# Patient Record
Sex: Female | Born: 1950 | Hispanic: No | Smoking: Never smoker
Health system: Southern US, Community
[De-identification: ages and names within clinical notes are randomized; demographics above are authoritative.]

## PROBLEM LIST (undated history)

## (undated) DIAGNOSIS — F329 Major depressive disorder, single episode, unspecified: Secondary | ICD-10-CM

## (undated) DIAGNOSIS — K589 Irritable bowel syndrome without diarrhea: Secondary | ICD-10-CM

## (undated) DIAGNOSIS — K52839 Microscopic colitis, unspecified: Secondary | ICD-10-CM

## (undated) DIAGNOSIS — J329 Chronic sinusitis, unspecified: Secondary | ICD-10-CM

## (undated) DIAGNOSIS — F32A Depression, unspecified: Secondary | ICD-10-CM

## (undated) HISTORY — DX: Major depressive disorder, single episode, unspecified: F32.9

## (undated) HISTORY — DX: Irritable bowel syndrome without diarrhea: K58.9

## (undated) HISTORY — PX: ABDOMINAL HYSTERECTOMY: SHX81

## (undated) HISTORY — PX: HELLER MYOTOMY: SHX5259

## (undated) HISTORY — DX: Chronic sinusitis, unspecified: J32.9

## (undated) HISTORY — DX: Depression, unspecified: F32.A

## (undated) HISTORY — DX: Microscopic colitis, unspecified: K52.839

---

## 2003-03-16 ENCOUNTER — Other Ambulatory Visit: Admission: RE | Admit: 2003-03-16 | Discharge: 2003-03-16 | Payer: Self-pay | Admitting: Obstetrics and Gynecology

## 2004-04-15 ENCOUNTER — Other Ambulatory Visit: Admission: RE | Admit: 2004-04-15 | Discharge: 2004-04-15 | Payer: Self-pay | Admitting: Obstetrics and Gynecology

## 2005-05-12 ENCOUNTER — Other Ambulatory Visit: Admission: RE | Admit: 2005-05-12 | Discharge: 2005-05-12 | Payer: Self-pay | Admitting: Obstetrics and Gynecology

## 2006-06-25 ENCOUNTER — Encounter: Admission: RE | Admit: 2006-06-25 | Discharge: 2006-06-25 | Payer: Self-pay | Admitting: Obstetrics and Gynecology

## 2007-07-01 ENCOUNTER — Encounter: Admission: RE | Admit: 2007-07-01 | Discharge: 2007-07-01 | Payer: Self-pay | Admitting: Obstetrics and Gynecology

## 2010-05-18 ENCOUNTER — Encounter: Payer: Self-pay | Admitting: Obstetrics and Gynecology

## 2014-12-20 ENCOUNTER — Encounter: Payer: Self-pay | Admitting: Gastroenterology

## 2015-02-12 ENCOUNTER — Ambulatory Visit: Payer: Self-pay | Admitting: Gastroenterology

## 2015-03-20 ENCOUNTER — Ambulatory Visit: Payer: Self-pay | Admitting: Gastroenterology

## 2015-05-31 ENCOUNTER — Encounter: Payer: Self-pay | Admitting: Gastroenterology

## 2015-07-23 ENCOUNTER — Encounter: Payer: Self-pay | Admitting: Gastroenterology

## 2015-07-23 ENCOUNTER — Ambulatory Visit (INDEPENDENT_AMBULATORY_CARE_PROVIDER_SITE_OTHER): Payer: Medicare Other | Admitting: Gastroenterology

## 2015-07-23 VITALS — BP 128/70 | HR 84 | Ht 59.0 in | Wt 96.8 lb

## 2015-07-23 DIAGNOSIS — K589 Irritable bowel syndrome without diarrhea: Secondary | ICD-10-CM | POA: Diagnosis not present

## 2015-07-23 MED ORDER — ELUXADOLINE 75 MG PO TABS: 1.0000 | ORAL_TABLET | Freq: Two times a day (BID) | ORAL | Status: DC

## 2015-07-23 NOTE — Patient Instructions (Addendum)
We will get records sent from your previous gastroenterologist (surgery in [facility] for achalasia) for review.   Recent FDA warning about viberzi and pancreatitis reviewed. That risk in for patients who (unlike you) do not have a gallbladder. Please return to see Dr. Christella HartiganJacobs in 3 months.

## 2015-07-23 NOTE — Progress Notes (Addendum)
She brought with her extensive records and her [facility] [place]  Colonoscopy February 2000, done for Hemoccult-positive stool, grandmother died of colon cancer, diarrhea; the examination was normal EGD February 2000 done for the same as above, there was scattered erythema of the duodenum biopsies were taken these showed "nonspecific fibrosis and chronic inflammation" Gastroparesis ; Gastric emptying scan June 2014, done for early satiety, findings "very limited gastric emptying with marked delay emptying time consistent with significant gastroparesis".  Had been on Reglan for some time. June 2008 [facility] first step testing "pattern not consistent with IBS"  Achalasia ; Heller myotomy, Upper GI radiology test October 2013 done for "projectile vomiting and GERD "findings most suggestive of achalasia"[facility] 2014 or 15, done for achalasia, one year later she required an EGD from what she tells me   HPI: This is a  very pleasant 65 year old woman  who was self-referred  Chief complaint is IBS, history of achalasia, history of gastroparesis  Stomach trouble about 23 years; she has loose stools after meals. She has not been losing weight.  She was told she had achalasia, [facility] for surgery that sounds like Heller Myotomy; She had a second procedure about a year later (sounds like EGD) and she's been fine since then.  Her swallowing has improved since then.  She takes nexium once daily for minor GERD issues.  She is here today for a prescription for viberzi and this helps. She calls it a miracle drug;  This has stopped her diarrhea.  Takes 75mg  pill, one pill twice daily.  Has been on it for 10 months.  She takes imodium  Pretty rarely now, 1-2 doses per month.  She used to take imodium daily.  She's had several colonoscopies.   I cannot tell when her last colonoscopy was.  Review of systems: Pertinent  positive and negative review of systems were noted in the above HPI section. Complete review of systems was performed and was otherwise normal.   Past Medical History  Diagnosis Date  . Depression   . Chronic sinusitis   . Microscopic colitis   . IBS (irritable bowel syndrome)     Past Surgical History  Procedure Laterality Date  . Abdominal hysterectomy    . Heller myotomy      Current Outpatient Prescriptions  Medication Sig Dispense Refill  . ALPRAZolam (XANAX) 0.5 MG tablet Take 0.5 mg by mouth as needed for anxiety.    . Eluxadoline (VIBERZI) 75 MG TABS Take 1 tablet by mouth 2 (two) times daily.    Marland Kitchen. esomeprazole (NEXIUM) 40 MG capsule Take 40 mg by mouth daily at 12 noon.    . promethazine (PHENERGAN) 25 MG tablet Take 25 mg by mouth every 6 (six) hours as needed for nausea or vomiting.    Marland Kitchen. zolpidem (AMBIEN) 10 MG tablet Take 10 mg by mouth at bedtime as needed for sleep.     No current facility-administered medications for this visit.    Allergies as of 07/23/2015  . (Not on File)    Family History  Problem Relation Age of Onset  . Heart disease Mother   . Coronary artery disease Father     Social History   Social History  . Marital Status: Unknown    Spouse Name: N/A  . Number of Children: N/A  . Years of Education: N/A   Occupational History  . Not on file.   Social History  Main Topics  . Smoking status: Never Smoker   . Smokeless tobacco: Not on file  . Alcohol Use: No  . Drug Use: No  . Sexual Activity: Not on file   Other Topics Concern  . Not on file   Social History Narrative     Physical Exam: BP 128/70 mmHg  Pulse 84  Ht  (1.499 m)  Wt 96 lb 12.8 oz (43.908 kg)  BMI 19.54 kg/m2 Constitutional: generally well-appearing Psychiatric: alert and oriented x3 Eyes: extraocular movements intact Mouth: oral pharynx moist, no lesions Neck: supple no lymphadenopathy Cardiovascular: heart regular rate and rhythm Lungs: clear to  auscultation bilaterally Abdomen: soft, nontender, nondistended, no obvious ascites, no peritoneal signs, normal bowel sounds Extremities: no lower extremity edema bilaterally Skin: no lesions on visible extremities   Assessment and plan: 65 y.o. female with  History of IBS diarrhea predominant, achalasia, gastroparesis I reviewed a large packet of information from her previous gastroenterologist, piecing together a very scattered history. Seems that she has history of achalasia surgically repaired at [facility] [place]. She has gastroparesis confirmed by gastric emptying scan with very poor emptying of the stomach, she has irritable bowel syndrome, deer diarrhea predominant. She really is here for a new prescription, refills on her Viberzi.  She calls this a miracle drug. Previously she had been on several Imodium and Lomotil pills per day with poor results. The viberzi  has significantly improved her control. Recent FDA warning about this medicine in   patients who have had her gallbladder removed showed that they have increased risk of pancreatitis. She still has her gallbladder in place. I refilled a prescription 75 mg one pill twice daily. She'll need to return here in 3 months to touch base again seen she is feeling. Some information that I will still need to determine, date of her last colonoscopy, any polyps that she may have had the past. I don't think she has ever had any polyps and she does not have a family history of colon cancer.   Rob Bunting, MD [facility] 07/23/2015, 2:42 PM  Cc:     Addendum:  Op Note, Dr. Nolen Mu 02/2012: Robot assisted Heller myotomy; seemed to go smoothly.

## 2015-07-25 ENCOUNTER — Telehealth: Payer: Self-pay | Admitting: Gastroenterology

## 2015-07-25 NOTE — Telephone Encounter (Signed)
I spoke with the pharmacy and gave a verbal order for viberzi the pt was notified to pick the rx up at her convenience and to call with any further concerns

## 2015-07-26 ENCOUNTER — Telehealth: Payer: Self-pay | Admitting: Gastroenterology

## 2015-07-29 MED ORDER — ELUXADOLINE 75 MG PO TABS: 1.0000 | ORAL_TABLET | Freq: Two times a day (BID) | ORAL | Status: DC

## 2015-07-29 NOTE — Telephone Encounter (Signed)
Pt prescription has been resent to the pharmacy.  Placed a call to the pt but the number was incorrect, no further numbers available.

## 2015-07-30 ENCOUNTER — Telehealth: Payer: Self-pay | Admitting: Gastroenterology

## 2015-07-31 NOTE — Telephone Encounter (Signed)
Prior auth was sent to the pharmacy for review.

## 2015-10-02 ENCOUNTER — Other Ambulatory Visit: Payer: Self-pay | Admitting: Obstetrics and Gynecology

## 2015-10-02 DIAGNOSIS — R928 Other abnormal and inconclusive findings on diagnostic imaging of breast: Secondary | ICD-10-CM

## 2015-10-31 ENCOUNTER — Ambulatory Visit
Admission: RE | Admit: 2015-10-31 | Discharge: 2015-10-31 | Disposition: A | Discharge status: Home / Self Care | Payer: Medicare Other | Source: Ambulatory Visit | Attending: Obstetrics and Gynecology | Admitting: Obstetrics and Gynecology

## 2015-10-31 DIAGNOSIS — R928 Other abnormal and inconclusive findings on diagnostic imaging of breast: Secondary | ICD-10-CM

## 2016-02-05 ENCOUNTER — Telehealth: Payer: Self-pay | Admitting: Gastroenterology

## 2016-02-05 MED ORDER — ESOMEPRAZOLE MAGNESIUM 40 MG PO CPDR: 40.0000 mg | DELAYED_RELEASE_CAPSULE | Freq: Every day | ORAL | 2 refills | Status: DC

## 2016-02-05 NOTE — Telephone Encounter (Signed)
Pt has been notified of the refills and aware to keep appt as scheduled

## 2016-03-27 ENCOUNTER — Other Ambulatory Visit: Payer: Self-pay

## 2016-03-27 MED ORDER — ESOMEPRAZOLE MAGNESIUM 40 MG PO CPDR: 40.0000 mg | DELAYED_RELEASE_CAPSULE | Freq: Every day | ORAL | 0 refills | Status: DC

## 2016-03-27 NOTE — Telephone Encounter (Signed)
Esomeprazole sent it, has appt in January.

## 2016-05-01 ENCOUNTER — Ambulatory Visit: Payer: Medicare Other | Admitting: Gastroenterology

## 2016-05-12 ENCOUNTER — Ambulatory Visit (INDEPENDENT_AMBULATORY_CARE_PROVIDER_SITE_OTHER): Payer: Medicare Other | Admitting: Gastroenterology

## 2016-05-12 ENCOUNTER — Encounter: Payer: Self-pay | Admitting: Gastroenterology

## 2016-05-12 ENCOUNTER — Encounter (INDEPENDENT_AMBULATORY_CARE_PROVIDER_SITE_OTHER): Payer: Self-pay

## 2016-05-12 VITALS — BP 80/60 | HR 78 | Ht 59.0 in | Wt 97.1 lb

## 2016-05-12 DIAGNOSIS — K219 Gastro-esophageal reflux disease without esophagitis: Secondary | ICD-10-CM

## 2016-05-12 MED ORDER — RANITIDINE HCL 150 MG PO TABS: 150.0000 mg | ORAL_TABLET | Freq: Every day | ORAL | 3 refills | Status: DC

## 2016-05-12 MED ORDER — ELUXADOLINE 75 MG PO TABS: 1.0000 | ORAL_TABLET | Freq: Two times a day (BID) | ORAL | 3 refills | Status: DC

## 2016-05-12 MED ORDER — ESOMEPRAZOLE MAGNESIUM 40 MG PO CPDR: 40.0000 mg | DELAYED_RELEASE_CAPSULE | Freq: Every day | ORAL | 3 refills | Status: DC

## 2016-05-12 NOTE — Patient Instructions (Addendum)
You should be eating several small meals per day because of your gastroparesis. Stay on nexium in AM; 20-30 min before breakfast. Start ranitidine 150mg , take one pill at bedtime every night. Call in 4-5 weeks to report on your response. Refills on viberzi, nexium (90 days with 3 refills).

## 2016-05-12 NOTE — Progress Notes (Signed)
HPI: This is a pleasant 66 year old woman whom I met originally about a year ago.  She was previously cared for in [place]. She has an extensive history with gastroenterologist up there. I met her for the first time about a year ago. Took quite some time to review all her extensive notes from her previous procedures, office visits from [place]. She has a history of achalasia surgically repaired the Heller myotomy, she has diarrhea predominant irritable bowel syndrome it seems to be under good control on Viberzi, she has gastroparesis confirmed by gastric emptying scan.   Chief complaint is liquid regurgitation, GERD  After drinking or eating she feels a liquid regurgitating, phlegm like.  Sometimes at night she will have to vomit.  Can also be shortly after eating.  This occurs only occasionally.   Takes nexium 60 min before breakfast.   She does not take any bedtime acid meds  No dysphagia.  No chest pains.  Overall weight has been increasing, gradually.  ROS: complete GI ROS as described in HPI.  Constitutional:  No unintentional weight loss   Past Medical History:  Diagnosis Date  . Chronic sinusitis   . Depression   . IBS (irritable bowel syndrome)   . Microscopic colitis     Past Surgical History:  Procedure Laterality Date  . ABDOMINAL HYSTERECTOMY    . HELLER MYOTOMY      Current Outpatient Prescriptions  Medication Sig Dispense Refill  . ALPRAZolam (XANAX) 0.5 MG tablet Take 0.5 mg by mouth as needed for anxiety.    . Eluxadoline (VIBERZI) 75 MG TABS Take 1 tablet by mouth 2 (two) times daily. 180 tablet 3  . promethazine (PHENERGAN) 25 MG tablet Take 25 mg by mouth every 6 (six) hours as needed for nausea or vomiting.    Marland Kitchen zolpidem (AMBIEN) 10 MG tablet Take 10 mg by mouth at bedtime as needed for sleep.    Marland Kitchen esomeprazole (NEXIUM) 40 MG capsule Take 1 capsule (40 mg total) by mouth daily at 12 noon. 90 capsule 0   No current facility-administered  medications for this visit.     Allergies as of 05/12/2016 - Review Complete 05/12/2016  Allergen Reaction Noted  . Penicillins  05/12/2016    Family History  Problem Relation Age of Onset  . Heart disease Mother   . Coronary artery disease Father     Social History   Social History  . Marital status: Divorced    Spouse name: N/A  . Number of children: 1  . Years of education: N/A   Occupational History  . retired    Social History Main Topics  . Smoking status: Never Smoker  . Smokeless tobacco: Never Used  . Alcohol use No  . Drug use: No  . Sexual activity: Not on file   Other Topics Concern  . Not on file   Social History Narrative  . No narrative on file     Physical Exam: BP (!) 80/60   Pulse 78   Ht 4' 11" (1.499 m)   Wt 97 lb 2 oz (44.1 kg)   BMI 19.62 kg/m  Constitutional: generally well-appearing Psychiatric: alert and oriented x3 Abdomen: soft, nontender, nondistended, no obvious ascites, no peritoneal signs, normal bowel sounds No peripheral edema noted in lower extremities  Assessment and plan: 66 y.o. female with History of achalasia, status post myotomy, chronic GERD  She has no current alarm symptoms that would necessitate further evaluation with upper endoscopy. She is not  taking her Nexium at the correct time and relation to meals and so I recommended she change that. I'm also adding H2 blocker at bedtime since she seems to have some regurgitation, vomiting overnight is probably GERD related. She needs refills on her Nexium and also refills on her Viberzi and happy to do that for her. She will call to report on her response to the medicine changes in 3-4 weeks and sooner if she is having any troubles.   Owens Loffler, MD [facility] 05/12/2016, 4:01 PM

## 2017-04-09 ENCOUNTER — Other Ambulatory Visit: Payer: Self-pay

## 2017-04-09 ENCOUNTER — Ambulatory Visit (INDEPENDENT_AMBULATORY_CARE_PROVIDER_SITE_OTHER): Payer: Medicare Other | Admitting: Gastroenterology

## 2017-04-09 ENCOUNTER — Encounter: Payer: Self-pay | Admitting: Gastroenterology

## 2017-04-09 VITALS — BP 104/64 | HR 65 | Ht 59.0 in | Wt 95.0 lb

## 2017-04-09 DIAGNOSIS — K219 Gastro-esophageal reflux disease without esophagitis: Secondary | ICD-10-CM

## 2017-04-09 DIAGNOSIS — R1310 Dysphagia, unspecified: Secondary | ICD-10-CM

## 2017-04-09 MED ORDER — PROMETHAZINE HCL 25 MG PO TABS: 25.0000 mg | ORAL_TABLET | Freq: Every day | ORAL | 3 refills | Status: AC | PRN

## 2017-04-09 MED ORDER — ESOMEPRAZOLE MAGNESIUM 40 MG PO CPDR: 40.0000 mg | DELAYED_RELEASE_CAPSULE | Freq: Every day | ORAL | 3 refills | Status: DC

## 2017-04-09 MED ORDER — ELUXADOLINE 75 MG PO TABS: 1.0000 | ORAL_TABLET | Freq: Every day | ORAL | 3 refills | Status: DC

## 2017-04-09 NOTE — Patient Instructions (Addendum)
[facility] for dysphagia, h/o heller myotomy   Refills on viberzi 75mg  once daily, nexium 40mg  once daily, promethatzine 25mg  once daily PRN). 90 days with 3 refills.  Normal BMI (Body Mass Index- based on height and weight) is between 23 and 30. Your BMI today is Body mass index is 19.19 kg/m. Marland Kitchen. Please consider follow up  regarding your BMI with your Primary Care Provider.

## 2017-04-09 NOTE — Progress Notes (Signed)
HPI: This is a very pleasant 66 year old woman whom I last saw almost a year ago.  Chief complaint is medicine refills, also intermittent odynophasia, dysphasia  Needs refills on several of her medicines (Nexium, Viberzi, Phenergan)  While eating she has retrosternal chest discomfort; will vomit phelgm.  This is not with every meal but at least several times a week.  She says this is not nearly as bad as when she had significant achalasia troubles leading into her Heller myotomy about 7 years ago at [place] [facility] [place].  She was previously cared for in [place]. She has an extensive history with gastroenterologist up there. I met her for the first time about a year ago. Took quite some time to review all her extensive notes from her previous procedures, office visits from [place]. She has a history of achalasia surgically repaired the Heller myotomy, she has diarrhea predominant irritable bowel syndrome it seems to be under good control on Viberzi, she has gastroparesis confirmed by gastric emptying scan.  She had heller myotomy at age 2  (at [facility]), then repeat surgery about a year later : she is not really sure what was done during the second surgery (also at [facility]).    Takes nexium once daily 61m pill, usually before breakfast.  She still gets pyrosis with spells of odyphagia above  Overall her weight is up 3-4 pounds in 3 months.  Bowels are regular, takes viberzi once daily. He ROS: complete GI ROS as described in HPI, all other review negative.  Constitutional:  No unintentional weight loss   Past Medical History:  Diagnosis Date  . Chronic sinusitis   . Depression   . IBS (irritable bowel syndrome)   . Microscopic colitis     Past Surgical History:  Procedure Laterality Date  . ABDOMINAL HYSTERECTOMY    . HELLER MYOTOMY      Current Outpatient Medications  Medication Sig Dispense Refill  . ALPRAZolam (XANAX) 0.5 MG tablet Take 0.5 mg by mouth as needed  for anxiety.    . ALPRAZolam (XANAX) 1 MG tablet Take 0.5 mg by mouth at bedtime as needed for anxiety.    . Eluxadoline (VIBERZI) 75 MG TABS Take 1 tablet by mouth daily.    .Marland Kitchenesomeprazole (NEXIUM) 40 MG capsule Take 40 mg by mouth daily.    . promethazine (PHENERGAN) 25 MG tablet Take 25 mg by mouth every 6 (six) hours as needed for nausea or vomiting.    .Marland Kitchenzolpidem (AMBIEN) 10 MG tablet Take 10 mg by mouth at bedtime as needed for sleep.     No current facility-administered medications for this visit.     Allergies as of 04/09/2017 - Review Complete 04/09/2017  Allergen Reaction Noted  . Penicillins  05/12/2016    Family History  Problem Relation Age of Onset  . Heart disease Mother   . Coronary artery disease Father   . Lupus Daughter   . Arthritis Daughter        RA  . Depression Daughter        lost child in MVA    Social History   Socioeconomic History  . Marital status: Divorced    Spouse name: Not on file  . Number of children: 1  . Years of education: Not on file  . Highest education level: Not on file  Social Needs  . Financial resource strain: Not on file  . Food insecurity - worry: Not on file  . Food insecurity - inability:  Not on file  . Transportation needs - medical: Not on file  . Transportation needs - non-medical: Not on file  Occupational History  . Occupation: retired  Tobacco Use  . Smoking status: Never Smoker  . Smokeless tobacco: Never Used  Substance and Sexual Activity  . Alcohol use: No    Alcohol/week: 0.0 oz  . Drug use: No  . Sexual activity: Not on file  Other Topics Concern  . Not on file  Social History Narrative  . Not on file     Physical Exam: BP 104/64   Pulse 65   Ht _0  (1.499 m)   Wt 95 lb (43.1 kg)   BMI 19.19 kg/m  Constitutional: generally well-appearing Psychiatric: alert and oriented x3 Abdomen: soft, nontender, nondistended, no obvious ascites, no peritoneal signs, normal bowel sounds No peripheral  edema noted in lower extremities  Assessment and plan: 66 y.o. female with irritable bowel syndrome, dysphasia with a history of Heller myotomy for achalasia  She describes episodes of dysphasia that resulted in a lot of phlegm regurgitation and vomiting.  These do not happen every day but it sounds like once or twice a week at least.  [facility] if she is getting a stricture at her GE junction related to previous Heller myotomy or perhaps resulting acid damage after the Heller myotomy.  I recommended we proceed with EGD and possible dilation.  Going to refill her usual IBS medicine, GERD medicine and antinausea medicine.  Please see the "Patient Instructions" section for addition details about the plan.  Owens Loffler, MD [facility] 04/09/2017, 9:54 AM

## 2017-05-21 ENCOUNTER — Encounter: Payer: Self-pay | Admitting: Gastroenterology

## 2017-06-04 ENCOUNTER — Other Ambulatory Visit: Payer: Self-pay

## 2017-06-04 ENCOUNTER — Encounter: Payer: Self-pay | Admitting: Gastroenterology

## 2017-06-04 ENCOUNTER — Ambulatory Visit (AMBULATORY_SURGERY_CENTER): Payer: Medicare Other | Admitting: Gastroenterology

## 2017-06-04 VITALS — BP 128/63 | HR 62 | Temp 98.9°F | Resp 13 | Ht 59.0 in | Wt 95.0 lb

## 2017-06-04 DIAGNOSIS — R1310 Dysphagia, unspecified: Secondary | ICD-10-CM | POA: Diagnosis not present

## 2017-06-04 DIAGNOSIS — K220 Achalasia of cardia: Secondary | ICD-10-CM | POA: Diagnosis not present

## 2017-06-04 DIAGNOSIS — K222 Esophageal obstruction: Secondary | ICD-10-CM | POA: Diagnosis not present

## 2017-06-04 HISTORY — PX: UPPER GASTROINTESTINAL ENDOSCOPY: SHX188

## 2017-06-04 MED ORDER — SODIUM CHLORIDE 0.9 % IV SOLN: 500.0000 mL | Freq: Once | INTRAVENOUS | Status: DC

## 2017-06-04 NOTE — Progress Notes (Signed)
I have reviewed the patient's medical history in detail and updated the computerized patient record.

## 2017-06-04 NOTE — Patient Instructions (Signed)
YOU HAD AN ENDOSCOPIC PROCEDURE TODAY AT THE [facility]:   Refer to the procedure report that was given to you for any specific questions about what was found during the examination.  If the procedure report does not answer your questions, please call your gastroenterologist to clarify.  If you requested that your care partner not be given the details of your procedure findings, then the procedure report has been included in a sealed envelope for you to review at your convenience later.  YOU SHOULD EXPECT: Some feelings of bloating in the abdomen. Passage of more gas than usual.  Walking can help get rid of the air that was put into your GI tract during the procedure and reduce the bloating. If you had a lower endoscopy (such as a colonoscopy or flexible sigmoidoscopy) you may notice spotting of blood in your stool or on the toilet paper. If you underwent a bowel prep for your procedure, you may not have a normal bowel movement for a few days.  Please Note:  You might notice some irritation and congestion in your nose or some drainage.  This is from the oxygen used during your procedure.  There is no need for concern and it should clear up in a day or so.  SYMPTOMS TO REPORT IMMEDIATELY:   Following upper endoscopy (EGD)  Vomiting of blood or coffee ground material  New chest pain or pain under the shoulder blades  Painful or persistently difficult swallowing  New shortness of breath  Fever of 100F or higher  Black, tarry-looking stools  Please see handouts given to you on esophagitis and stricture.  For urgent or emergent issues, a gastroenterologist can be reached at any hour by calling [phone].   DIET: Clear liquids to start at 9:10 am until 10:10 am then soft diet for the rest of the day. Please see and handout on post dilation diet. Tomorrow you may proceed to your regular diet.  Drink plenty of fluids but you should avoid alcoholic beverages for 24  hours.  ACTIVITY:  You should plan to take it easy for the rest of today and you should NOT DRIVE or use heavy machinery until tomorrow (because of the sedation medicines used during the test).    FOLLOW UP: Our staff will call the number listed on your records the next business day following your procedure to check on you and address any questions or concerns that you may have regarding the information given to you following your procedure. If we do not reach you, we will leave a message.  However, if you are feeling well and you are not experiencing any problems, there is no need to return our call.  We will assume that you have returned to your regular daily activities without incident.  If any biopsies were taken you will be contacted by phone or by letter within the next 1-3 weeks.  Please call us at [phone] if you have not heard about the biopsies in 3 weeks.    SIGNATURES/CONFIDENTIALITY: You and/or your care partner have signed paperwork which will be entered into your electronic medical record.  These signatures attest to the fact that that the information above on your After Visit Summary has been reviewed and is understood.  Full responsibility of the confidentiality of this discharge information lies with you and/or your care-partner.  Thank you for letting us take care of your healthcare needs today.

## 2017-06-04 NOTE — Op Note (Signed)
[facility] Patient Name: Hannah Murillo Procedure Date: 06/04/2017 7:48 AM MRN: [id number] Endoscopist: Rachael Fee , MD Age: 67 Referring MD:  Date of Birth: [birth year] Gender: Female Account #: [id number] Procedure:                Upper GI endoscopy Indications:              Dysphagia; s/p remote Heller myotomy at [facility] for                            achalasia, one year later another UGI surgery of                            some type. No weigth loss. Medicines:                Monitored Anesthesia Care Procedure:                Pre-Anesthesia Assessment:                           - Prior to the procedure, a History and Physical                            was performed, and patient medications and                            allergies were reviewed. The patient's tolerance of                            previous anesthesia was also reviewed. The risks                            and benefits of the procedure and the sedation                            options and risks were discussed with the patient.                            All questions were answered, and informed consent                            was obtained. Prior Anticoagulants: The patient has                            taken no previous anticoagulant or antiplatelet                            agents. ASA Grade Assessment: II - A patient with                            mild systemic disease. After reviewing the risks                            and benefits, the patient was deemed in  satisfactory condition to undergo the procedure.                           After obtaining informed consent, the endoscope was                            passed under direct vision. Throughout the                            procedure, the patient's blood pressure, pulse, and                            oxygen saturations were monitored continuously. The                            Model GIF-[id number](SN#[id number])  scope was introduced                            through the mouth, and advanced to the second part                            of duodenum. The upper GI endoscopy was                            accomplished without difficulty. The patient                            tolerated the procedure well. Scope In: Scope Out: Findings:                 Evidence of previous Heller myotomy at the GE                            junction. The lumen through the site was slightly                            narrow with normal mucosa. Dilation with an                            18-19-20 mm TTS balloon dilator was performed to 20                            mm.                           The esophagus proximal to GE junction was dilated.                           The examination was otherwise normal. Complications:            No immediate complications. Estimated blood loss:                            None. Estimated Blood Loss:     Estimated blood loss: none. Impression:               -  Heller myotomy site note, slightly narrowed and                            was therefore dilated with TTS balloon.                           - The esophagus was dilated overall.                           - I suspect your trouble swallowing is mainly                            related to progression of your Achalasia instead of                            the slight narrowing noted above. Recommendation:           - Patient has a contact number available for                            emergencies. The signs and symptoms of potential                            delayed complications were discussed with the                            patient. Return to normal activities tomorrow.                            Written discharge instructions were provided to the                            patient.                           - Resume previous diet.                           - Continue present medications.                           -  Please call Dr. Christella Hartigan' office in 3-4 week to                            report your swallowing response to this dilation.                            If you are still having difficulty, will consider                            manometry to test to 'restage' your achalasia. Rachael Fee, MD 06/04/2017 8:16:37 AM This report has been signed electronically.

## 2017-06-04 NOTE — Progress Notes (Signed)
Called to room to assist during endoscopic procedure.  Patient ID and intended procedure confirmed with present staff. Received instructions for my participation in the procedure from the performing physician.  

## 2017-06-04 NOTE — Progress Notes (Signed)
A and O x3. Report to RN. Tolerated MAC anesthesia well.Teeth unchanged after procedure.

## 2017-06-07 ENCOUNTER — Telehealth: Payer: Self-pay

## 2017-06-07 NOTE — Telephone Encounter (Signed)
  Follow up Call-  Call back number 06/04/2017  Post procedure Call Back phone  # [phone]  Permission to leave phone message Yes  Some recent data might be hidden     Patient questions:  Do you have a fever, pain , or abdominal swelling? No. Pain Score  0 *  Have you tolerated food without any problems? Yes.    Have you been able to return to your normal activities? Yes.    Do you have any questions about your discharge instructions: Diet   No. Medications  No. Follow up visit  No.  Do you have questions or concerns about your Care? No.  Actions: * If pain score is 4 or above: No action needed, pain <4.   Pt had questions regarding checking at [facility], chart reviewed and no new medications ordered.  Pt also questioned what sodium chloride is, stated that it was her IV that she had received.  Pt stated understanding of all.

## 2017-06-26 ENCOUNTER — Other Ambulatory Visit: Payer: Self-pay | Admitting: Gastroenterology

## 2017-08-23 ENCOUNTER — Encounter: Payer: Self-pay | Admitting: Gastroenterology

## 2017-08-23 ENCOUNTER — Telehealth: Payer: Self-pay | Admitting: Gastroenterology

## 2017-08-23 ENCOUNTER — Ambulatory Visit: Payer: Medicare Other | Admitting: Gastroenterology

## 2017-08-23 ENCOUNTER — Encounter (INDEPENDENT_AMBULATORY_CARE_PROVIDER_SITE_OTHER): Payer: Self-pay

## 2017-08-23 VITALS — BP 114/62 | HR 70 | Ht 59.0 in | Wt 93.2 lb

## 2017-08-23 DIAGNOSIS — K220 Achalasia of cardia: Secondary | ICD-10-CM | POA: Diagnosis not present

## 2017-08-23 DIAGNOSIS — R079 Chest pain, unspecified: Secondary | ICD-10-CM | POA: Diagnosis not present

## 2017-08-23 NOTE — Patient Instructions (Addendum)
If you have increasing problems with your eating, please call and likely we will schedule Esophageal manometry testing. Referral to cardiology for intermittent chest pains, mild SOB.  Normal BMI (Body Mass Index- based on height and weight) is between 23 and 30. Your BMI today is Body mass index is 18.83 kg/m. Marland Kitchen Please consider follow up  regarding your BMI with your Primary Care Provider.

## 2017-08-23 NOTE — Progress Notes (Signed)
Review of pertinent gastrointestinal problems: 1. Achalasia: remote Heller myotomy at [facility], post operatively she needed another operation on her esophagus/stomach (?). Recurrent dysphagia led to EGD Dr. Christella Hartigan 05/2017: Heller myotomy site was noted, was slightly narrow and so was dilated with a balloon to 20 mm.  The esophagus was dilated overall.  I suspected that her swallowing difficulty was related to achalasia effect on esophageal peristalsis rather than the minor narrowing at the [facility] myotomy site.  HPI: This is a very pleasant 67 year old woman whom I last saw the time of an upper endoscopy  She has burning in her chest with phlegm production during eating.  Very rare pains in her chest, jaw pain during eating.  She really has noticed no improvement, changes since the EGD dilation 3 months ago.  She is under a lot of stress, child back at home causing her a lot of stress recently  Chief complaint is intermittent chest pains  ROS: complete GI ROS as described in HPI, all other review negative.  Constitutional:  No unintentional weight loss   Past Medical History:  Diagnosis Date  . Chronic sinusitis   . Depression   . IBS (irritable bowel syndrome)   . Microscopic colitis     Past Surgical History:  Procedure Laterality Date  . ABDOMINAL HYSTERECTOMY    . HELLER MYOTOMY    . UPPER GASTROINTESTINAL ENDOSCOPY  06/04/2017    Current Outpatient Medications  Medication Sig Dispense Refill  . ALPRAZolam (XANAX) 1 MG tablet Take 0.5 mg by mouth at bedtime as needed for anxiety.    . Eluxadoline (VIBERZI) 75 MG TABS Take 1 tablet by mouth daily. 90 tablet 3  . esomeprazole (NEXIUM) 40 MG capsule Take 1 capsule (40 mg total) by mouth daily. 90 capsule 3  . esomeprazole (NEXIUM) 40 MG capsule TAKE ONE CAPSULE BY MOUTH AT 12 NOON 90 capsule 2  . promethazine (PHENERGAN) 25 MG tablet Take 1 tablet (25 mg total) by mouth daily as needed for nausea or vomiting. 90 tablet 3  . zolpidem  (AMBIEN) 10 MG tablet Take 10 mg by mouth at bedtime as needed for sleep.     Current Facility-Administered Medications  Medication Dose Route Frequency Provider Last Rate Last Dose  . 0.9 %  sodium chloride infusion  500 mL Intravenous Once Rachael Fee, MD        Allergies as of 08/23/2017 - Review Complete 08/23/2017  Allergen Reaction Noted  . Penicillins  05/12/2016    Family History  Problem Relation Age of Onset  . Heart disease Mother   . Coronary artery disease Father   . Lupus Daughter   . Arthritis Daughter        RA  . Depression Daughter        lost child in MVA    Social History   Socioeconomic History  . Marital status: Divorced    Spouse name: Not on file  . Number of children: 1  . Years of education: Not on file  . Highest education level: Not on file  Occupational History  . Occupation: retired  Engineer, production  . Financial resource strain: Not on file  . Food insecurity:    Worry: Not on file    Inability: Not on file  . Transportation needs:    Medical: Not on file    Non-medical: Not on file  Tobacco Use  . Smoking status: Never Smoker  . Smokeless tobacco: Never Used  Substance and Sexual Activity  .  Alcohol use: No    Alcohol/week: 0.0 oz  . Drug use: No  . Sexual activity: Not on file  Lifestyle  . Physical activity:    Days per week: Not on file    Minutes per session: Not on file  . Stress: Not on file  Relationships  . Social connections:    Talks on phone: Not on file    Gets together: Not on file    Attends religious service: Not on file    Active member of club or organization: Not on file    Attends meetings of clubs or organizations: Not on file    Relationship status: Not on file  . Intimate partner violence:    Fear of current or ex partner: Not on file    Emotionally abused: Not on file    Physically abused: Not on file    Forced sexual activity: Not on file  Other Topics Concern  . Not on file  Social History  Narrative  . Not on file     Physical Exam: BP 114/62   Pulse 70   Ht  (1.499 m)   Wt 93 lb 4 oz (42.3 kg)   BMI 18.83 kg/m  Constitutional: generally well-appearing Psychiatric: alert and oriented x3 Abdomen: soft, nontender, nondistended, no obvious ascites, no peritoneal signs, normal bowel sounds No peripheral edema noted in lower extremities  Assessment and plan: 67 y.o. female with intermittent chest pains  First she does have intermittent phlegm in her throat, burning in her chest.  Some swallowing difficulty at times.  The esophageal dilation does not seem to have made much improvement.  I offered further testing to see if her achalasia has progressed with an esophageal manometry however she does not want to pursue that currently.  She will call if she changes her mind.  She also has some pressure-like chest pain associate with mild shortness of breath.  These episodes last about 2 minutes.  They are not necessarily related to exercise.  They are not related to eating.  I recommended she see a cardiologist for further evaluation for it.  She agrees I will make that referral.  Please see the "Patient Instructions" section for addition details about the plan.  Rob Bunting, MD [facility] 08/23/2017, 11:14 AM

## 2017-08-23 NOTE — Telephone Encounter (Signed)
Cardiology referral made today for patient to see Dr Teofilo Pod at [facility] on 08/27/17 at 3;30pm for SOB and CP. A message was left on patient voice mail with information regarding her appointment. I asked her to call the office to voice understanding. All of today's office visit notes were faxed to there office.

## 2018-02-01 ENCOUNTER — Ambulatory Visit: Payer: Medicare Other | Admitting: Gastroenterology

## 2018-02-17 ENCOUNTER — Other Ambulatory Visit: Payer: Self-pay | Admitting: Gastroenterology

## 2018-02-17 MED ORDER — ELUXADOLINE 75 MG PO TABS: 75.0000 mg | ORAL_TABLET | Freq: Every day | ORAL | 0 refills | Status: DC

## 2018-02-21 ENCOUNTER — Other Ambulatory Visit: Payer: Self-pay | Admitting: Gastroenterology

## 2018-03-21 ENCOUNTER — Ambulatory Visit: Payer: Medicare Other | Admitting: Gastroenterology

## 2018-03-21 ENCOUNTER — Encounter: Payer: Self-pay | Admitting: Gastroenterology

## 2018-03-21 ENCOUNTER — Encounter (INDEPENDENT_AMBULATORY_CARE_PROVIDER_SITE_OTHER): Payer: Self-pay

## 2018-03-21 VITALS — BP 110/76 | HR 68 | Ht 59.0 in | Wt 94.0 lb

## 2018-03-21 DIAGNOSIS — R079 Chest pain, unspecified: Secondary | ICD-10-CM

## 2018-03-21 DIAGNOSIS — K220 Achalasia of cardia: Secondary | ICD-10-CM | POA: Diagnosis not present

## 2018-03-21 NOTE — Patient Instructions (Addendum)
Hi resolution esophageal manometry for h/o achalasia, s/p Heller at [facility] 2011, no with intermittent chest discomforts.  Nexium should be before breakfast only.  Start pepcid (famotidine) 20mg  pill at bedtime nightly.  You have been scheduled for an esophageal manometry at [facility] on 04/04/18 at 12:30. Please arrive 30 minutes prior to your procedure for registration. You will need to go to outpatient registration (1st floor of the hospital) first. Make certain to bring your insurance cards as well as a complete list of medications.  Please remember the following:  1) Do not take any muscle relaxants, xanax (alprazolam) or ativan for 1 day prior to your test as well as the day of the test.  2) Nothing to eat or drink for 4 hours before your test.  3) Hold all diabetic medications/insulin the morning of the test. You may eat and take your medications after the test.  It will take at least 2 weeks to receive the results of this test from your physician. ------------------------------------------ ABOUT ESOPHAGEAL MANOMETRY Esophageal manometry (muh-NOM-uh-tree) is a test that gauges how well your esophagus works. Your esophagus is the long, muscular tube that connects your throat to your stomach. Esophageal manometry measures the rhythmic muscle contractions (peristalsis) that occur in your esophagus when you swallow. Esophageal manometry also measures the coordination and force exerted by the muscles of your esophagus.  During esophageal manometry, a thin, flexible tube (catheter) that contains sensors is passed through your nose, down your esophagus and into your stomach. Esophageal manometry can be helpful in diagnosing some mostly uncommon disorders that affect your esophagus.  Why it's done Esophageal manometry is used to evaluate the movement (motility) of food through the esophagus and into the stomach. The test measures how well the circular bands of muscle (sphincters) at the  top and bottom of your esophagus open and close, as well as the pressure, strength and pattern of the wave of esophageal muscle contractions that moves food along.  What you can expect Esophageal manometry is an outpatient procedure done without sedation. Most people tolerate it well. You may be asked to change into a hospital gown before the test starts.  During esophageal manometry  . While you are sitting up, a member of your health care team sprays your throat with a numbing medication or puts numbing gel in your nose or both.  . A catheter is guided through your nose into your esophagus. The catheter may be sheathed in a water-filled sleeve. It doesn't interfere with your breathing. However, your eyes may water, and you may gag. You may have a slight nosebleed from irritation.  . After the catheter is in place, you may be asked to lie on your back on an exam table, or you may be asked to remain seated.  . You then swallow small sips of water. As you do, a computer connected to the catheter records the pressure, strength and pattern of your esophageal muscle contractions.  . During the test, you'll be asked to breathe slowly and smoothly, remain as still as possible, and swallow only when you're asked to do so.  . A member of your health care team may move the catheter down into your stomach while the catheter continues its measurements.  . The catheter then is slowly withdrawn. The test usually lasts 20 to 30 minutes.  After esophageal manometry  When your esophageal manometry is complete, you may return to your normal activities  This test typically takes 30-45 minutes to complete.  Thank you for entrusting me with your care and choosing [facility].  Dr Christella HartiganJacobs  ________________________________________________________________________________     ------------------------------------------   . ________________________________________________________________________________

## 2018-03-21 NOTE — Progress Notes (Signed)
Review of pertinent gastrointestinal problems: 1. Achalasia: remote Heller myotomy at [facility] 2011, post operatively she needed another operation on her esophagus/stomach (?). Recurrent dysphagia led to EGD Dr. Christella Hartigan 05/2017: Heller myotomy site was noted, was slightly narrow and so was dilated with a balloon to 20 mm.  The esophagus was dilated overall.  I suspected that her swallowing difficulty was related to achalasia effect on esophageal peristalsis rather than the minor narrowing at the [facility] myotomy site.  07/2017 offered hi res manometry but she declined.  HPI: This is a very pleasant 67 yo woman.   I last saw her about 6-7 months ago.  Referred her for cardiac testing given her chest pressure and SOB.  She saw a cardiologist: did a stress test, echo, EKG and she was told her heart is "OK."  She tried increasing her nexium to twice a day.  She was waking up at night with with spells of chest pressure, heaviness, needed to take a deep breath.  Sometimes vomiting.  When she starts a meal she has burning in her esophagus and intermittent vomiting.  She's noticed that certain foods (italian sauces) aggravate.  Overall her weight is up a few pounds  Chief complaint is chest pressure  ROS: complete GI ROS as described in HPI, all other review negative.  Constitutional:  No unintentional weight loss   Past Medical History:  Diagnosis Date  . Chronic sinusitis   . Depression   . IBS (irritable bowel syndrome)   . Microscopic colitis     Past Surgical History:  Procedure Laterality Date  . ABDOMINAL HYSTERECTOMY    . HELLER MYOTOMY    . UPPER GASTROINTESTINAL ENDOSCOPY  06/04/2017    Current Outpatient Medications  Medication Sig Dispense Refill  . ALPRAZolam (XANAX) 1 MG tablet Take 0.5 mg by mouth at bedtime as needed for anxiety.    . Eluxadoline (VIBERZI) 75 MG TABS Take 75 mg by mouth daily. 30 tablet 0  . esomeprazole (NEXIUM) 40 MG capsule Take 1 capsule (40 mg total) by  mouth daily. 90 capsule 3  . promethazine (PHENERGAN) 25 MG tablet Take 1 tablet (25 mg total) by mouth daily as needed for nausea or vomiting. 90 tablet 3  . zolpidem (AMBIEN) 10 MG tablet Take 10 mg by mouth at bedtime as needed for sleep.     Current Facility-Administered Medications  Medication Dose Route Frequency Provider Last Rate Last Dose  . 0.9 %  sodium chloride infusion  500 mL Intravenous Once Rachael Fee, MD        Allergies as of 03/21/2018 - Review Complete 03/21/2018  Allergen Reaction Noted  . Penicillins  05/12/2016    Family History  Problem Relation Age of Onset  . Heart disease Mother   . Coronary artery disease Father   . Lupus Daughter   . Arthritis Daughter        RA  . Depression Daughter        lost child in MVA    Social History   Socioeconomic History  . Marital status: Divorced    Spouse name: Not on file  . Number of children: 1  . Years of education: Not on file  . Highest education level: Not on file  Occupational History  . Occupation: retired  Engineer, production  . Financial resource strain: Not on file  . Food insecurity:    Worry: Not on file    Inability: Not on file  . Transportation needs:  Medical: Not on file    Non-medical: Not on file  Tobacco Use  . Smoking status: Never Smoker  . Smokeless tobacco: Never Used  Substance and Sexual Activity  . Alcohol use: No    Alcohol/week: 0.0 standard drinks  . Drug use: No  . Sexual activity: Not on file  Lifestyle  . Physical activity:    Days per week: Not on file    Minutes per session: Not on file  . Stress: Not on file  Relationships  . Social connections:    Talks on phone: Not on file    Gets together: Not on file    Attends religious service: Not on file    Active member of club or organization: Not on file    Attends meetings of clubs or organizations: Not on file    Relationship status: Not on file  . Intimate partner violence:    Fear of current or ex  partner: Not on file    Emotionally abused: Not on file    Physically abused: Not on file    Forced sexual activity: Not on file  Other Topics Concern  . Not on file  Social History Narrative  . Not on file     Physical Exam: BP 110/76   Pulse 68   Ht 4\' 11"  (1.499 m)   Wt 94 lb (42.6 kg)   BMI 18.99 kg/m  Constitutional: generally well-appearing Psychiatric: alert and oriented x3 Abdomen: soft, nontender, nondistended, no obvious ascites, no peritoneal signs, normal bowel sounds No peripheral edema noted in lower extremities  Assessment and plan: 67 y.o. female with h/o achalasia, now with intermittent chest pressure  GERD related vs. Achalasia related.  I recommended nexium in AM to continue, pepcid at bedtime (new start) and hi res esophageal manometry to see how her esophagus functions s/p 2011 [facility] heller myotomy.   Please see the "Patient Instructions" section for addition details about the plan.  Rob Buntinganiel Carrigan Delafuente, MD [facility] 03/21/2018, 10:18 AM

## 2018-04-04 ENCOUNTER — Ambulatory Visit (HOSPITAL_COMMUNITY)
Admission: RE | Admit: 2018-04-04 | Discharge: 2018-04-04 | Disposition: A | Discharge status: Home / Self Care | Payer: Medicare Other | Source: Ambulatory Visit | Attending: Gastroenterology | Admitting: Gastroenterology

## 2018-04-04 ENCOUNTER — Telehealth: Payer: Self-pay | Admitting: Gastroenterology

## 2018-04-04 ENCOUNTER — Encounter (HOSPITAL_COMMUNITY)
Admission: RE | Disposition: A | Discharge status: Home / Self Care | Payer: Self-pay | Source: Ambulatory Visit | Attending: Gastroenterology

## 2018-04-04 ENCOUNTER — Other Ambulatory Visit: Payer: Self-pay | Admitting: Gastroenterology

## 2018-04-04 DIAGNOSIS — R0789 Other chest pain: Secondary | ICD-10-CM | POA: Diagnosis not present

## 2018-04-04 DIAGNOSIS — R079 Chest pain, unspecified: Secondary | ICD-10-CM | POA: Diagnosis not present

## 2018-04-04 DIAGNOSIS — K220 Achalasia of cardia: Secondary | ICD-10-CM | POA: Diagnosis not present

## 2018-04-04 DIAGNOSIS — R12 Heartburn: Secondary | ICD-10-CM | POA: Diagnosis present

## 2018-04-04 HISTORY — PX: ESOPHAGEAL MANOMETRY: SHX5429

## 2018-04-04 SURGERY — MANOMETRY, ESOPHAGUS

## 2018-04-04 MED ORDER — ESOMEPRAZOLE MAGNESIUM 40 MG PO CPDR: 40.0000 mg | DELAYED_RELEASE_CAPSULE | Freq: Every day | ORAL | 3 refills | Status: DC

## 2018-04-04 MED ORDER — ELUXADOLINE 75 MG PO TABS: 75.0000 mg | ORAL_TABLET | Freq: Every day | ORAL | 0 refills | Status: DC

## 2018-04-04 MED ORDER — LIDOCAINE VISCOUS HCL 2 % MT SOLN
OROMUCOSAL | Status: AC
  Filled 2018-04-04: qty 15

## 2018-04-04 SURGICAL SUPPLY — 2 items
FACESHIELD LNG OPTICON STERILE (SAFETY) IMPLANT
GLOVE BIO SURGEON STRL SZ8 (GLOVE) ×6 IMPLANT

## 2018-04-04 NOTE — Progress Notes (Signed)
Esophageal manometry done per protocol.  Patient tolerated well.  Report sent to Dr Kavitha Nandigam.   

## 2018-04-07 ENCOUNTER — Encounter (HOSPITAL_COMMUNITY): Payer: Self-pay | Admitting: Gastroenterology

## 2018-04-13 ENCOUNTER — Telehealth: Payer: Self-pay | Admitting: Gastroenterology

## 2018-04-13 NOTE — Telephone Encounter (Signed)
Pt called about the results from her [facility]

## 2018-04-14 NOTE — Telephone Encounter (Signed)
I routed the esophageal manometry report to you. Please advise once you have reviewed. Thank you

## 2018-04-14 NOTE — Telephone Encounter (Signed)
Can you please let her know that the manometry shows achalasia is still affecting her esophagus (as expected) and that she MAY benefit from dilation of her esophagus (or redo surgery).  I'd like her to come into the office to discuss the options, can you please schedule for my next available ROV.  thanks

## 2018-04-14 NOTE — Telephone Encounter (Signed)
Patient is advised. She states she would like to come discuss the treatment options with her doctor. Accepts an appointment 05/17/18 at 11:15 am.

## 2018-04-24 DIAGNOSIS — R12 Heartburn: Secondary | ICD-10-CM

## 2018-04-24 DIAGNOSIS — K220 Achalasia of cardia: Secondary | ICD-10-CM

## 2018-04-24 DIAGNOSIS — R0789 Other chest pain: Secondary | ICD-10-CM

## 2018-05-17 ENCOUNTER — Ambulatory Visit (INDEPENDENT_AMBULATORY_CARE_PROVIDER_SITE_OTHER): Payer: Medicare Other | Admitting: Gastroenterology

## 2018-05-17 ENCOUNTER — Encounter: Payer: Self-pay | Admitting: Gastroenterology

## 2018-05-17 VITALS — BP 124/68 | HR 62 | Ht 59.0 in | Wt 92.2 lb

## 2018-05-17 DIAGNOSIS — K220 Achalasia of cardia: Secondary | ICD-10-CM

## 2018-05-17 MED ORDER — FAMOTIDINE 40 MG PO TABS: 40.0000 mg | ORAL_TABLET | Freq: Every day | ORAL | 11 refills | Status: DC

## 2018-05-17 MED ORDER — ESOMEPRAZOLE MAGNESIUM 40 MG PO CPDR: 40.0000 mg | DELAYED_RELEASE_CAPSULE | Freq: Every day | ORAL | 3 refills | Status: AC

## 2018-05-17 NOTE — Progress Notes (Signed)
Review of pertinent gastrointestinal problems: 1.Achalasia:[facility], post operatively she needed another operation on her esophagus/stomach (?). Recurrent dysphagia led to EGD Dr. Christella HartiganJacobs 05/2017: Heller myotomy site was noted, was slightly narrow and so was dilated with a balloon to 20 mm. The esophagus was dilated overall. I suspected that her swallowing difficulty was related to achalasia effect on esophageal peristalsis rather than [facility].  07/2017 offered hi res manometry but she declined.   03/2018 hi res esophageal manometry: type II achalasia.  HPI: This is a very pleasant 68 year old woman whom I last saw about 2 months ago.  Has lost 5-6 pounds in the past year.  She has burning in her chest while eating.  Takes an hour to eat a small plate of food.  She is concerned about redo heller because of the long recover the last time.  Drinks milk shakes, ensures.  Chief complaint is achalasia  ROS: complete GI ROS as described in HPI, all other review negative.  Constitutional:  No unintentional weight loss   Past Medical History:  Diagnosis Date  . Chronic sinusitis   . Depression   . IBS (irritable bowel syndrome)   . Microscopic colitis     Past Surgical History:  Procedure Laterality Date  . ABDOMINAL HYSTERECTOMY    . ESOPHAGEAL MANOMETRY N/A 04/04/2018   Procedure: ESOPHAGEAL MANOMETRY (EM);  Surgeon: Napoleon FormNandigam, Kavitha V, MD;  Location: [facility];  Service: Endoscopy;  Laterality: N/A;  . HELLER MYOTOMY    . UPPER GASTROINTESTINAL ENDOSCOPY  06/04/2017    Current Outpatient Medications  Medication Sig Dispense Refill  . ALPRAZolam (XANAX) 1 MG tablet Take 0.5 mg by mouth at bedtime as needed for anxiety.    . Eluxadoline (VIBERZI) 75 MG TABS Take 75 mg by mouth daily. 30 tablet 0  . esomeprazole (NEXIUM) 40 MG capsule Take 1 capsule (40 mg total) by mouth daily. 90 capsule 3  . famotidine (PEPCID)  20 MG tablet Take 20 mg by mouth at bedtime.    . promethazine (PHENERGAN) 25 MG tablet Take 1 tablet (25 mg total) by mouth daily as needed for nausea or vomiting. 90 tablet 3  . zolpidem (AMBIEN) 10 MG tablet Take 10 mg by mouth at bedtime as needed for sleep.     No current facility-administered medications for this visit.     Allergies as of 05/17/2018 - Review Complete 05/17/2018  Allergen Reaction Noted  . Penicillins  05/12/2016    Family History  Problem Relation Age of Onset  . Heart disease Mother   . Coronary artery disease Father   . Lupus Daughter   . Arthritis Daughter        RA  . Depression Daughter        lost child in MVA    Social History   Socioeconomic History  . Marital status: Divorced    Spouse name: Not on file  . Number of children: 1  . Years of education: Not on file  . Highest education level: Not on file  Occupational History  . Occupation: retired  Engineer, productionocial Needs  . Financial resource strain: Not on file  . Food insecurity:    Worry: Not on file    Inability: Not on file  . Transportation needs:    Medical: Not on file    Non-medical: Not on file  Tobacco Use  . Smoking status: Never Smoker  . Smokeless tobacco: Never Used  Substance and Sexual Activity  . Alcohol use: No    Alcohol/week: 0.0 standard drinks  . Drug use: No  . Sexual activity: Not on file  Lifestyle  . Physical activity:    Days per week: Not on file    Minutes per session: Not on file  . Stress: Not on file  Relationships  . Social connections:    Talks on phone: Not on file    Gets together: Not on file    Attends religious service: Not on file    Active member of club or organization: Not on file    Attends meetings of clubs or organizations: Not on file    Relationship status: Not on file  . Intimate partner violence:    Fear of current or ex partner: Not on file    Emotionally abused: Not on file    Physically abused: Not on file    Forced sexual  activity: Not on file  Other Topics Concern  . Not on file  Social History Narrative  . Not on file     Physical Exam: BP 124/68   Pulse 62   Ht 4\' 11"  (1.499 m)   Wt 92 lb 4 oz (41.8 kg)   BMI 18.63 kg/m  Constitutional: generally well-appearing Psychiatric: alert and oriented x3 Abdomen: soft, nontender, nondistended, no obvious ascites, no peritoneal signs, normal bowel sounds No peripheral edema noted in lower extremities  Assessment and plan: 68 y.o. female with type II achalasia  She really struggles to maintain her calories, nutritional status.  Takes her about an hour to eat a small plate of food.  She does drink 1 Ensure a day and I encouraged her to drink more of them.  She wanted her antiacid medicines called in mail-order as it would be much less expensive for her and I am happy to do that for her.  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  She is not sure she wants to do any of these therapies.  She did agree however to sit down with 1 of my partners who performs pneumatic dilation to at least hear more about it.  Also ROV with me in 3-4 months.  Please see the "Patient Instructions" section for addition details about the plan.  Rob Bunting, MD [facility] 05/17/2018, 11:24 AM

## 2018-05-17 NOTE — Patient Instructions (Addendum)
New script generic nexium 40mg  pill (esomeprazole) one pill once daily, take it 20-30 before breakfast meal. New mail script.  Pepcid (famotidine) 40mg  pill, one pill at bedtime nightly. (new mail script)  OV with Dr. Despina Hick to discuss balloon dilation.  Please return to see Dr. Christella Hartigan in 3-4 months, focus on nutrition.  Thank you for entrusting me with your care and choosing [facility].  Dr Christella Hartigan

## 2018-06-09 ENCOUNTER — Encounter: Payer: Self-pay | Admitting: Gastroenterology

## 2018-06-09 ENCOUNTER — Ambulatory Visit: Payer: Medicare Other | Admitting: Gastroenterology

## 2018-06-09 VITALS — BP 92/60 | HR 70 | Ht 59.0 in | Wt 95.2 lb

## 2018-06-09 DIAGNOSIS — K220 Achalasia of cardia: Secondary | ICD-10-CM | POA: Diagnosis not present

## 2018-06-09 DIAGNOSIS — R1310 Dysphagia, unspecified: Secondary | ICD-10-CM | POA: Diagnosis not present

## 2018-06-09 NOTE — Patient Instructions (Signed)
You have been scheduled for a Barium Esophogram at [facility] (1st floor of the hospital) on 06/20/2018 at 10:30am. Please arrive 15 minutes prior to your appointment for registration. Make certain not to have anything to eat or drink 3 hours prior to your test. If you need to reschedule for any reason, please contact radiology at [phone] to do so. __________________________________________________________________ A barium swallow is an examination that concentrates on views of the esophagus. This tends to be a double contrast exam (barium and two liquids which, when combined, create a gas to distend the wall of the oesophagus) or single contrast (non-ionic iodine based). The study is usually tailored to your symptoms so a good history is essential. Attention is paid during the study to the form, structure and configuration of the esophagus, looking for functional disorders (such as aspiration, dysphagia, achalasia, motility and reflux) EXAMINATION You may be asked to change into a gown, depending on the type of swallow being performed. A radiologist and radiographer will perform the procedure. The radiologist will advise you of the type of contrast selected for your procedure and direct you during the exam. You will be asked to stand, sit or lie in several different positions and to hold a small amount of fluid in your mouth before being asked to swallow while the imaging is performed .In some instances you may be asked to swallow barium coated marshmallows to assess the motility of a solid food bolus. The exam can be recorded as a digital or video fluoroscopy procedure. POST PROCEDURE It will take 1-2 days for the barium to pass through your system. To facilitate this, it is important, unless otherwise directed, to increase your fluids for the next 24-48hrs and to resume your normal diet.  This test typically takes about 30 minutes to perform.  If you are age 7 or older, your body mass  index should be between 23-30. Your Body mass index is 19.23 kg/m. If this is out of the aforementioned range listed, please consider follow up with your Primary Care Provider.  If you are age 26 or younger, your body mass index should be between 19-25. Your Body mass index is 19.23 kg/m. If this is out of the aformentioned range listed, please consider follow up with your Primary Care Provider.    I appreciate the  opportunity to care for you  Thank You   Marsa Aris , MD  __________________________________________________________________________________

## 2018-06-09 NOTE — Progress Notes (Addendum)
Hannah QuintBetty Tsosie    [id number]    07/26/1950  Primary Care Physician:Settle, Vic RipperPaul C, MD  Referring Physician: Aniceto BossSettle, Paul C, MD [address] [place], [place] [id number]  Chief complaint: Achalasia HPI: 68 year old female with history of achalasia status post Heller myotomy at [facility] in 2011. Patient states that she had to have an esophageal dilation within a year after Heller myotomy, kids not available to review.  She did not follow-up after that.  She presented with recurrent dysphagia February 2019, underwent EGD by Dr. Christella HartiganJacobs, [facility], dilated with TTS balloon to 20 mm.  Esophagus was diffusely dilated. 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 Patient reports some improvement after balloon dilation by Dr. Christella HartiganJacobs.  She is able to tolerate soft foods and is no longer losing weight.  She is reluctant to undergo repeat surgery or procedure at this point. Denies any nausea, vomiting, abdominal pain, melena or bright red blood per rectum.     Outpatient Encounter Medications as of 06/09/2018  Medication Sig  . ALPRAZolam (XANAX) 1 MG tablet Take 0.5 mg by mouth at bedtime as needed for anxiety.  . Eluxadoline (VIBERZI) 75 MG TABS Take 75 mg by mouth daily.  Marland Kitchen. esomeprazole (NEXIUM) 40 MG capsule Take 1 capsule (40 mg total) by mouth daily.  . famotidine (PEPCID) 40 MG tablet Take 40 mg by mouth daily.  . promethazine (PHENERGAN) 25 MG tablet Take 1 tablet (25 mg total) by mouth daily as needed for nausea or vomiting.  Marland Kitchen. zolpidem (AMBIEN) 10 MG tablet Take 10 mg by mouth at bedtime as needed for sleep.  . [DISCONTINUED] famotidine (PEPCID) 20 MG tablet Take 20 mg by mouth at bedtime.  . [DISCONTINUED] famotidine (PEPCID) 40 MG tablet Take 1 tablet (40 mg total) by mouth at bedtime.   No facility-administered encounter medications on file as of 06/09/2018.     Allergies as of  06/09/2018 - Review Complete 06/09/2018  Allergen Reaction Noted  . Penicillins  05/12/2016    Past Medical History:  Diagnosis Date  . Chronic sinusitis   . Depression   . IBS (irritable bowel syndrome)   . Microscopic colitis     Past Surgical History:  Procedure Laterality Date  . ABDOMINAL HYSTERECTOMY    . ESOPHAGEAL MANOMETRY N/A 04/04/2018   Procedure: ESOPHAGEAL MANOMETRY (EM);  Surgeon: Napoleon FormNandigam, Shadee Rathod V, MD;  Location: [facility];  Service: Endoscopy;  Laterality: N/A;  . HELLER MYOTOMY    . UPPER GASTROINTESTINAL ENDOSCOPY  06/04/2017    Family History  Problem Relation Age of Onset  . Heart disease Mother   . Coronary artery disease Father   . Lupus Daughter   . Arthritis Daughter        RA  . Depression Daughter        lost child in MVA  . AAA (abdominal aortic aneurysm) Neg Hx   . Colon cancer Neg Hx   . Stomach cancer Neg Hx     Social History   Socioeconomic History  . Marital status: Divorced    Spouse name: Not on file  . Number of children: 1  . Years of education: Not on file  . Highest education level: Not on file  Occupational History  . Occupation: retired  Engineer, productionocial Needs  . Financial resource strain: Not on file  . Food insecurity:    Worry: Not on  file    Inability: Not on file  . Transportation needs:    Medical: Not on file    Non-medical: Not on file  Tobacco Use  . Smoking status: Never Smoker  . Smokeless tobacco: Never Used  Substance and Sexual Activity  . Alcohol use: No    Alcohol/week: 0.0 standard drinks  . Drug use: No  . Sexual activity: Not on file  Lifestyle  . Physical activity:    Days per week: Not on file    Minutes per session: Not on file  . Stress: Not on file  Relationships  . Social connections:    Talks on phone: Not on file    Gets together: Not on file    Attends religious service: Not on file    Active member of club or organization: Not on file    Attends meetings of clubs or organizations:  Not on file    Relationship status: Not on file  . Intimate partner violence:    Fear of current or ex partner: Not on file    Emotionally abused: Not on file    Physically abused: Not on file    Forced sexual activity: Not on file  Other Topics Concern  . Not on file  Social History Narrative  . Not on file      Review of systems: Review of Systems  Constitutional: Negative for fever and chills.  HENT: Positive sinus problem Eyes: Negative for blurred vision.  Respiratory: Negative for cough, shortness of breath and wheezing.   Cardiovascular: Negative for chest pain and palpitations.  Gastrointestinal: as per HPI Genitourinary: Negative for dysuria, urgency, frequency and hematuria.  Musculoskeletal: Negative for myalgias, back pain and joint pain.  Skin: Negative for itching and rash.  Neurological: Negative for dizziness, tremors, focal weakness, seizures and loss of consciousness. Positive frequent headaches Endo/Heme/Allergies: Positive for seasonal allergies.  Psychiatric/Behavioral: Negative for depression, suicidal ideas and hallucinations. Positive anxiety All other systems reviewed and are negative.   Physical Exam: Vitals:   06/09/18 0900  BP: 92/60  Pulse: 70   Body mass index is 19.23 kg/m. Gen:      No acute distress HEENT:  EOMI, sclera anicteric Neck:     No masses; no thyromegaly Lungs:    Clear to auscultation bilaterally; normal respiratory effort CV:         Regular rate and rhythm; no murmurs Abd:      + bowel sounds; soft, non-tender; no palpable masses, no distension Ext:    No edema; adequate peripheral perfusion Skin:      Warm and dry; no rash Neuro: alert and oriented x 3 Psych: normal mood and affect  Data Reviewed:  Reviewed labs, radiology imaging, old records and pertinent past GI work up   Assessment and Plan/Recommendations:  68 year old female with achalasia status post Heller myotomy 2011, TTS balloon dilation to 20 mm  February 2019, esophageal manometry December 2019 with persistent EGJ outflow obstruction and incomplete relaxation of LES.  Discussed available options to relieve the EGJ outflow obstruction with history of achalasia.  Extend Heller myotomy versus POEM versus pneumatic balloon dilation.  Also discussed in detail the potential risks and benefit associated with pneumatic balloon dilation  Patient is definitely not interested in undergoing repeat surgery.  She is also reluctant to undergo any procedure (POEM or pneumatic balloon dilation) at this point.  She feels that she is tolerating diet better after 20 mm balloon esophageal dilation and starting Nexium.  She  is no longer losing weight.  Will schedule barium esophagram to evaluate EG junction and see if there is any flow flow of barium through it or if she is retaining significant amount   Based on esophagram findings, will re discuss further management  Continue Nexium daily Continue antireflux measures    K. Scherry Ran , MD [phone]    CC: Aniceto Boss, MD

## 2018-06-13 NOTE — Progress Notes (Signed)
Hannah Murillo, I appreciate you seeing her.  Thanks  DJ

## 2018-06-20 ENCOUNTER — Ambulatory Visit (HOSPITAL_COMMUNITY): Payer: Medicare Other

## 2018-06-29 ENCOUNTER — Ambulatory Visit (HOSPITAL_COMMUNITY)
Admission: RE | Admit: 2018-06-29 | Discharge: 2018-06-29 | Disposition: A | Discharge status: Home / Self Care | Payer: Medicare Other | Source: Ambulatory Visit | Attending: Gastroenterology | Admitting: Gastroenterology

## 2018-06-29 DIAGNOSIS — R1310 Dysphagia, unspecified: Secondary | ICD-10-CM | POA: Insufficient documentation

## 2018-06-29 DIAGNOSIS — K220 Achalasia of cardia: Secondary | ICD-10-CM | POA: Diagnosis present

## 2018-10-10 ENCOUNTER — Other Ambulatory Visit: Payer: Self-pay | Admitting: Gastroenterology

## 2018-10-10 ENCOUNTER — Telehealth: Payer: Self-pay | Admitting: Gastroenterology

## 2018-10-10 MED ORDER — VIBERZI 75 MG PO TABS: 75.0000 mg | ORAL_TABLET | Freq: Every day | ORAL | 3 refills | Status: DC

## 2018-10-10 NOTE — Telephone Encounter (Signed)
Patient called wanted to speak with the nurse about medication Eluxadoline (VIBERZI) 75 MG TABS [[id number]]

## 2018-12-14 ENCOUNTER — Encounter: Payer: Self-pay | Admitting: Gastroenterology

## 2019-04-06 ENCOUNTER — Encounter (INDEPENDENT_AMBULATORY_CARE_PROVIDER_SITE_OTHER): Payer: Self-pay

## 2019-04-06 ENCOUNTER — Ambulatory Visit: Payer: Medicare Other | Admitting: Gastroenterology

## 2019-04-06 VITALS — BP 104/60 | HR 76 | Temp 97.6°F | Ht 59.0 in | Wt 94.1 lb

## 2019-04-06 DIAGNOSIS — K220 Achalasia of cardia: Secondary | ICD-10-CM | POA: Diagnosis not present

## 2019-04-06 DIAGNOSIS — K589 Irritable bowel syndrome without diarrhea: Secondary | ICD-10-CM

## 2019-04-06 DIAGNOSIS — R1310 Dysphagia, unspecified: Secondary | ICD-10-CM | POA: Diagnosis not present

## 2019-04-06 DIAGNOSIS — K224 Dyskinesia of esophagus: Secondary | ICD-10-CM | POA: Diagnosis not present

## 2019-04-06 MED ORDER — HYOSCYAMINE SULFATE 0.125 MG SL SUBL: 0.1250 mg | SUBLINGUAL_TABLET | Freq: Three times a day (TID) | SUBLINGUAL | 3 refills | Status: AC

## 2019-04-06 NOTE — Patient Instructions (Signed)
We will send your new prescription of Levsin to your pharmacy  We will refer you to [facility] and contact you with that appointment   If you are age 69 or older, your body mass index should be between 23-30. Your Body mass index is 19.01 kg/m. If this is out of the aforementioned range listed, please consider follow up with your Primary Care Provider.  If you are age 34 or younger, your body mass index should be between 19-25. Your Body mass index is 19.01 kg/m. If this is out of the aformentioned range listed, please consider follow up with your Primary Care Provider.    I appreciate the  opportunity to care for you  Thank You   Harl Bowie , MD

## 2019-04-06 NOTE — Progress Notes (Signed)
Hannah Murillo    [id number]    1951-04-18  Primary Care Physician:Settle, Hall Busing, MD  Referring Physician: Josem Kaufmann, [place] [address]   Chief complaint:  Chest pain, Dysphagia.  HPI: 68 year old female with history of achalasia s/p Heller myotomy at [facility] in 2011 with complaints of recurrent dysphagia and regurgitation She is having trouble swallowing tough meat, chunky solids and dense bread.  Denies any problem with liquids.  She has to regurgitate solid food sometimes back up but that is rare.  Mostly she regurgitates mucus or liquid. Denies any abdominal pain, melena, rectal bleeding, loss of appetite or weight loss.  She is drinking protein shakes, Ensure and is able to maintain her weight.  She has intermittent spasms or atypical chest pain.  Denies any shortness of breath, fever, chills.  February 2019, underwent EGD by Dr. Ardis Hughs, noted narrowing at site of [facility] myotomy, dilated with TTS balloon to 20 mm.  Esophagus was diffusely dilated. 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  Patient was seen in February 2020 and was recommended to undergo either pneumatic dilation or program to relieve E GJ outflow obstruction but she was reluctant to do anything at that point. She lost significant weight after Heller myotomy as her diet was restricted and she is worried that that might happen after procedure and expressed concern  No family history of GI malignancy.  Outpatient Encounter Medications as of 04/06/2019  Medication Sig  . ALPRAZolam (XANAX) 1 MG tablet Take 0.5 mg by mouth at bedtime as needed for anxiety.  . Eluxadoline (VIBERZI) 75 MG TABS Take 75 mg by mouth daily.  Marland Kitchen esomeprazole (NEXIUM) 40 MG capsule Take 1 capsule (40 mg total) by mouth daily.  . famotidine (PEPCID) 40 MG tablet Take 40 mg by mouth daily.  . promethazine (PHENERGAN) 25 MG tablet Take 1 tablet  (25 mg total) by mouth daily as needed for nausea or vomiting.  Marland Kitchen zolpidem (AMBIEN) 10 MG tablet Take 10 mg by mouth at bedtime as needed for sleep.   No facility-administered encounter medications on file as of 04/06/2019.    Allergies as of 04/06/2019 - Review Complete 06/09/2018  Allergen Reaction Noted  . Penicillins  05/12/2016    Past Medical History:  Diagnosis Date  . Chronic sinusitis   . Depression   . IBS (irritable bowel syndrome)   . Microscopic colitis     Past Surgical History:  Procedure Laterality Date  . ABDOMINAL HYSTERECTOMY    . ESOPHAGEAL MANOMETRY N/A 04/04/2018   Procedure: ESOPHAGEAL MANOMETRY (EM);  Surgeon: Mauri Pole, MD;  Location: [facility];  Service: Endoscopy;  Laterality: N/A;  . HELLER MYOTOMY    . UPPER GASTROINTESTINAL ENDOSCOPY  06/04/2017    Family History  Problem Relation Age of Onset  . Heart disease Mother   . Coronary artery disease Father   . Lupus Daughter   . Arthritis Daughter        RA  . Depression Daughter        lost child in [place]  . AAA (abdominal aortic aneurysm) Neg Hx   . Colon cancer Neg Hx   . Stomach cancer Neg Hx     Social History   Socioeconomic History  . Marital status: Divorced    Spouse name: Not on file  . Number of children: 1  . Years of  education: Not on file  . Highest education level: Not on file  Occupational History  . Occupation: retired  Tobacco Use  . Smoking status: Never Smoker  . Smokeless tobacco: Never Used  Substance and Sexual Activity  . Alcohol use: No    Alcohol/week: 0.0 standard drinks  . Drug use: No  . Sexual activity: Not on file  Other Topics Concern  . Not on file  Social History Narrative  . Not on file   Social Determinants of Health   Financial Resource Strain:   . Difficulty of Paying Living Expenses: Not on file  Food Insecurity:   . Worried About Programme researcher, broadcasting/film/video in the Last Year: Not on file  . Ran Out of Food in the Last Year: Not on  file  Transportation Needs:   . Lack of Transportation (Medical): Not on file  . Lack of Transportation (Non-Medical): Not on file  Physical Activity:   . Days of Exercise per Week: Not on file  . Minutes of Exercise per Session: Not on file  Stress:   . Feeling of Stress : Not on file  Social Connections:   . Frequency of Communication with Friends and Family: Not on file  . Frequency of Social Gatherings with Friends and Family: Not on file  . Attends Religious Services: Not on file  . Active Member of Clubs or Organizations: Not on file  . Attends Banker Meetings: Not on file  . Marital Status: Not on file  Intimate Partner Violence:   . Fear of Current or Ex-Partner: Not on file  . Emotionally Abused: Not on file  . Physically Abused: Not on file  . Sexually Abused: Not on file      Review of systems: Review of Systems  Constitutional: Negative for fever and chills.  HENT: Negative.   Eyes: Negative for blurred vision.  Respiratory: Negative for cough, shortness of breath and wheezing.   Cardiovascular: Positive for chest pain and palpitations.  Gastrointestinal: as per HPI Genitourinary: Negative for dysuria, urgency, frequency and hematuria.  Musculoskeletal: Negative for myalgias, back pain and joint pain.  Skin: Negative for itching and rash.  Neurological: Negative for dizziness, tremors, focal weakness, seizures and loss of consciousness.  Endo/Heme/Allergies: Positive for seasonal allergies.  Psychiatric/Behavioral: Negative for depression, suicidal ideas and hallucinations.  All other systems reviewed and are negative.   Physical Exam: Vitals:   04/06/19 1039  BP: 104/60  Pulse: 76  Temp: 97.6 F (36.4 C)   Body mass index is 19.01 kg/m. Gen:      No acute distress HEENT:  EOMI, sclera anicteric Neck:     No masses; no thyromegaly Lungs:    Clear to auscultation bilaterally; normal respiratory effort CV:         Regular rate and  rhythm; no murmurs Abd:      + bowel sounds; soft, non-tender; no palpable masses, no distension Ext:    No edema; adequate peripheral perfusion Skin:      Warm and dry; no rash Neuro: alert and oriented x 3 Psych: normal mood and affect  Data Reviewed:  Reviewed labs, radiology imaging, old records and pertinent past GI work up   Assessment and Plan/Recommendations:  68 year old female with history of achalasia s/p Heller myotomy in 2011 at [facility], with recurrent dysphagia and regurgitation. EGD negative for any mass lesion in February 29, showed diffusely dilated esophagus and narrowing at site of Heller myotomy.  Dilated with 20 mm TTS  balloon.  No significant improvement of symptoms Esophageal manometry consistent with type II achalasia, persistent E GJ outflow obstruction  Discussed in detail different options available to relieve the E GJ outflow obstruction with achalasia. Extended Heller myotomy with cisplatin versus pneumatic balloon dilation.  Explained potential benefit and risks involved with each procedure. Patient is interested in undergoing POEM, will [facility] and management  Continue high-protein diet Continue Ensure Antireflux measures  IBS and esophageal spasms: Levsin sublingual daily as needed  Return in 3 months or sooner if needed  25 minutes was spent face-to-face with the patient. Greater than 50% of the time used for counseling as well as treatment plan and follow-up. She had multiple questions which were answered to her satisfaction  K. Scherry RanVeena Shalamar Crays , MD    CC: Aniceto BossSettle, Paul C, MD

## 2019-04-13 ENCOUNTER — Telehealth: Payer: Self-pay | Admitting: *Deleted

## 2019-04-13 NOTE — Telephone Encounter (Signed)
Called [phone] to get referral form to fill out for patient   [place] will fax over referral forms

## 2019-04-14 ENCOUNTER — Other Ambulatory Visit: Payer: Self-pay | Admitting: Gastroenterology

## 2019-04-14 NOTE — Telephone Encounter (Signed)
Faxed over referral form and all notes and insurance cards

## 2019-04-16 ENCOUNTER — Other Ambulatory Visit: Payer: Self-pay | Admitting: Gastroenterology

## 2019-05-07 IMAGING — RF DG ESOPHAGUS
12 of 16 series · 12 of 24 positions shown · non-contrast
Comparison: None available.

CLINICAL DATA: 67-year-old female with dysphagia. History of
achalasia, remote [place] myotomy. Upper endoscopy in May 2017
with dilatation using a 20 millimeter balloon dilator.

EXAM:
ESOPHOGRAM / BARIUM SWALLOW / BARIUM TABLET STUDY
TECHNIQUE: Combined double contrast and single contrast examination performed
using effervescent crystals, thick barium liquid, and thin barium
liquid. The patient was observed with fluoroscopy swallowing a 13 mm
barium sulphate tablet.
FLUOROSCOPY TIME:  Fluoroscopy Time:  3 minutes and 42 seconds
Radiation Exposure Index (if provided by the fluoroscopic device):
23.3 mGy
Number of Acquired Spot Images: 0

[Series 1: cp_standard · 0.53mm/px · 1 of 58 frames shown (1 of 12)]
[frame 30/58]
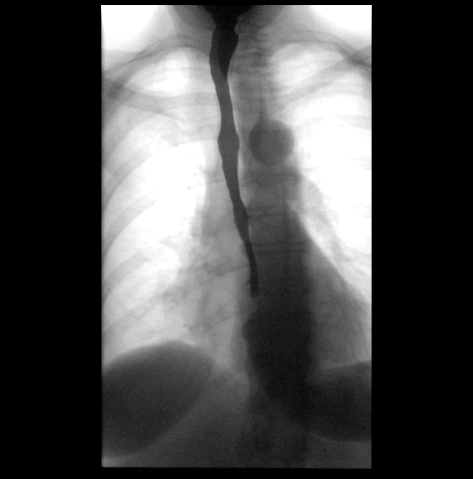

[Series 3: cp_standard · 0.35mm/px · 1 of 52 frames shown (2 of 12)]
[frame 8/52]
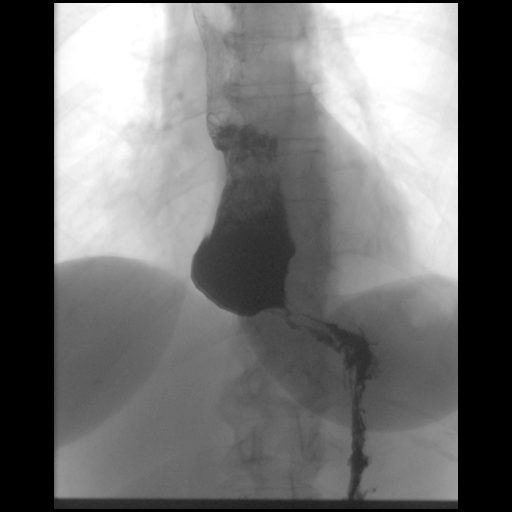

[Series 5: cp_standard · 0.18mm/px · 1 of 1 slices shown (3 of 12)]
[im 1/1]
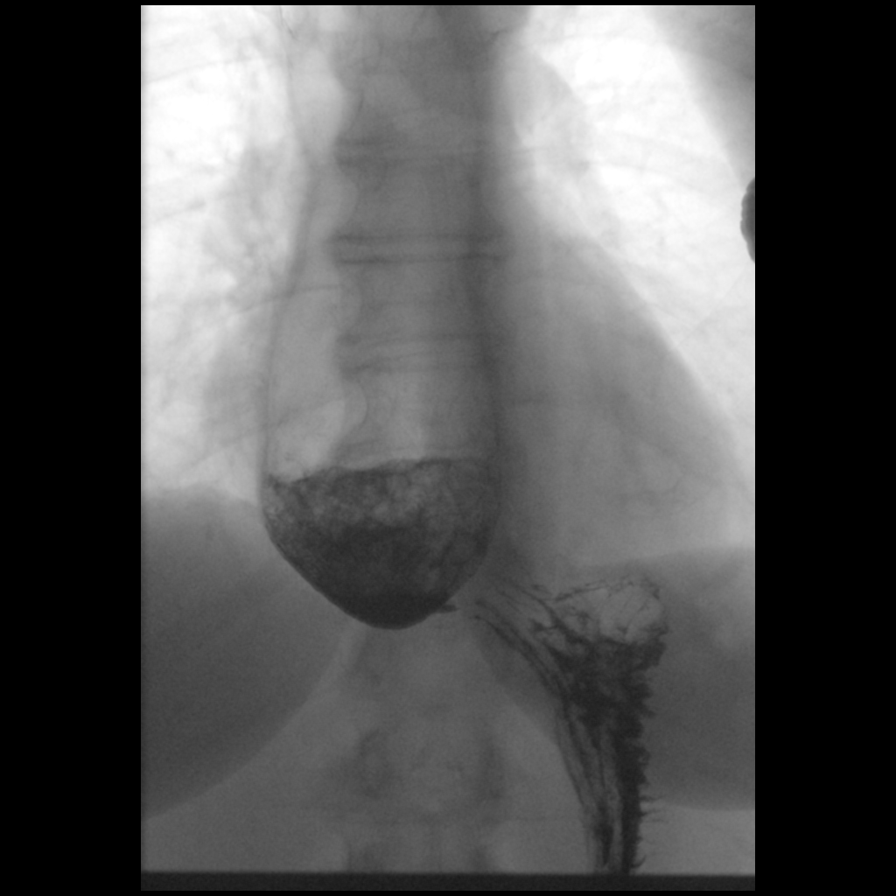

[Series 6: cp_standard · 0.35mm/px · 1 of 19 frames shown (4 of 12)]
[frame 17/19]
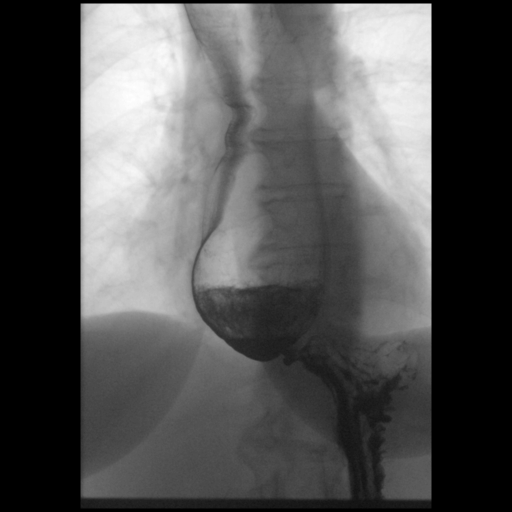

[Series 7: cp_standard · 0.35mm/px · 1 of 15 frames shown (5 of 12)]
[frame 8/15]
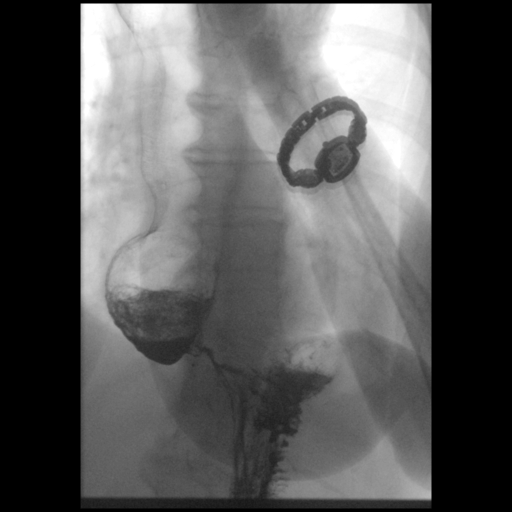

[Series 8: cp_standard · 0.35mm/px · 1 of 64 frames shown (6 of 12)]
[frame 33/64]
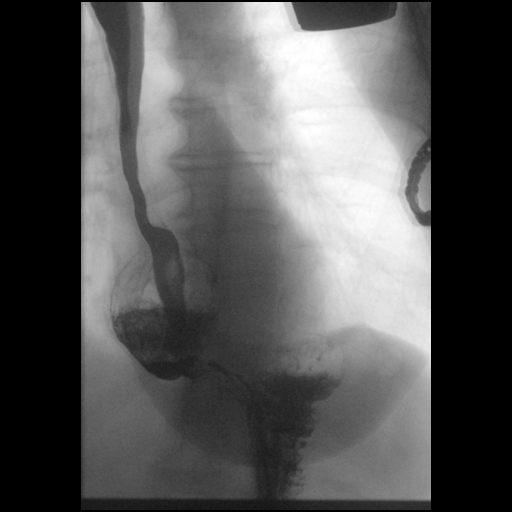

[Series 11: cp_standard · 0.19mm/px · 1 of 1 slices shown (7 of 12)]
[im 1/1]
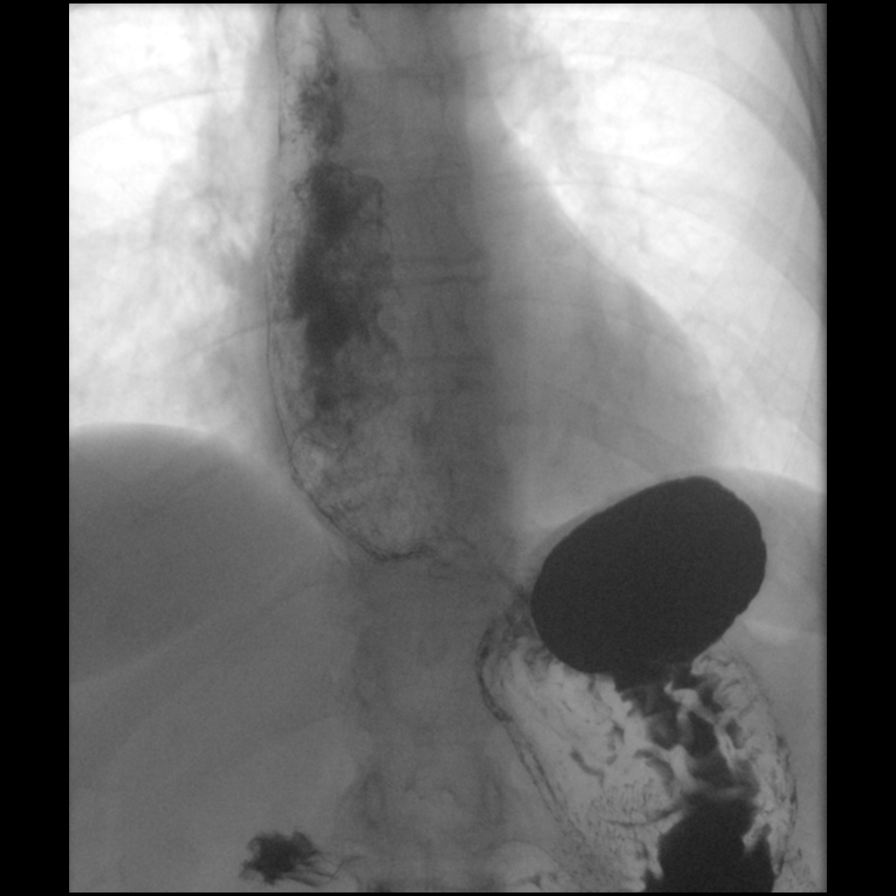

[Series 13: cp_standard · 0.38mm/px · 1 of 83 frames shown (8 of 12)]
[frame 71/83]
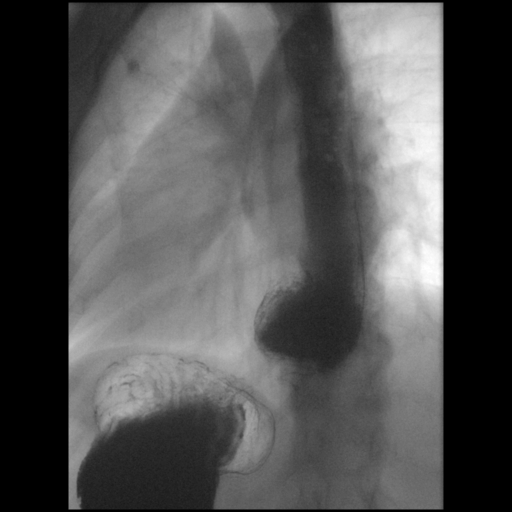

[Series 16: cp_standard · 0.20mm/px · 1 of 1 slices shown (9 of 12)]
[im 1/1]
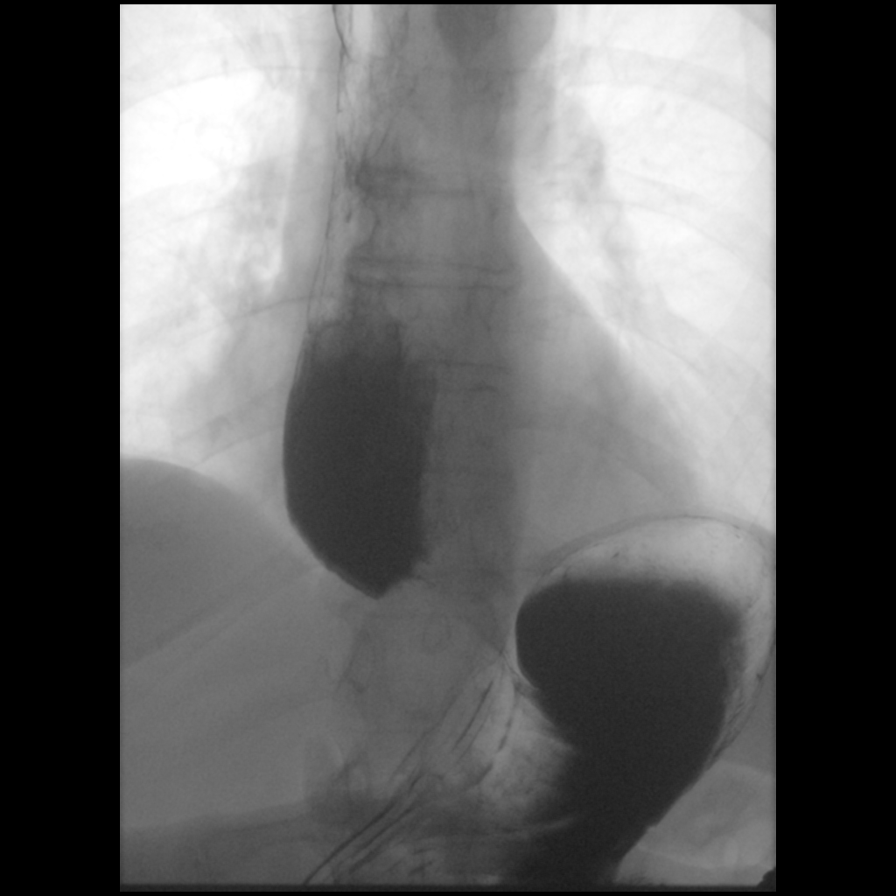

[Series 17: cp_standard · 0.42mm/px · 1 of 50 frames shown (10 of 12)]
[frame 43/50]
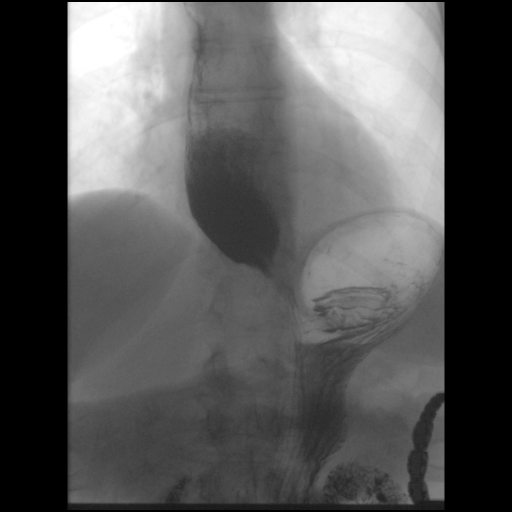

[Series 18: cp_standard · 0.42mm/px · 1 of 38 frames shown (11 of 12)]
[frame 20/38]
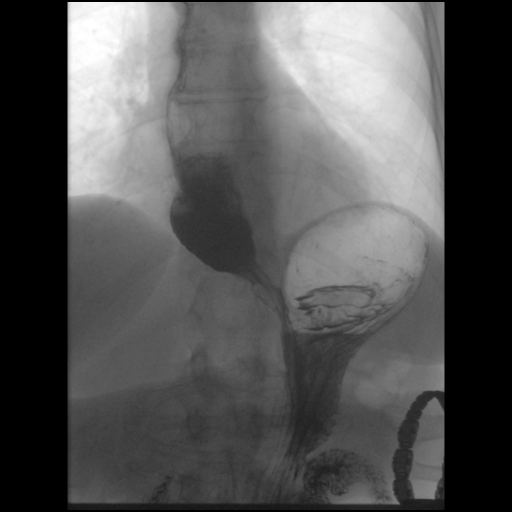

[Series 21: cp_standard · 0.21mm/px · 1 of 1 slices shown (12 of 12)]
[im 1/1]
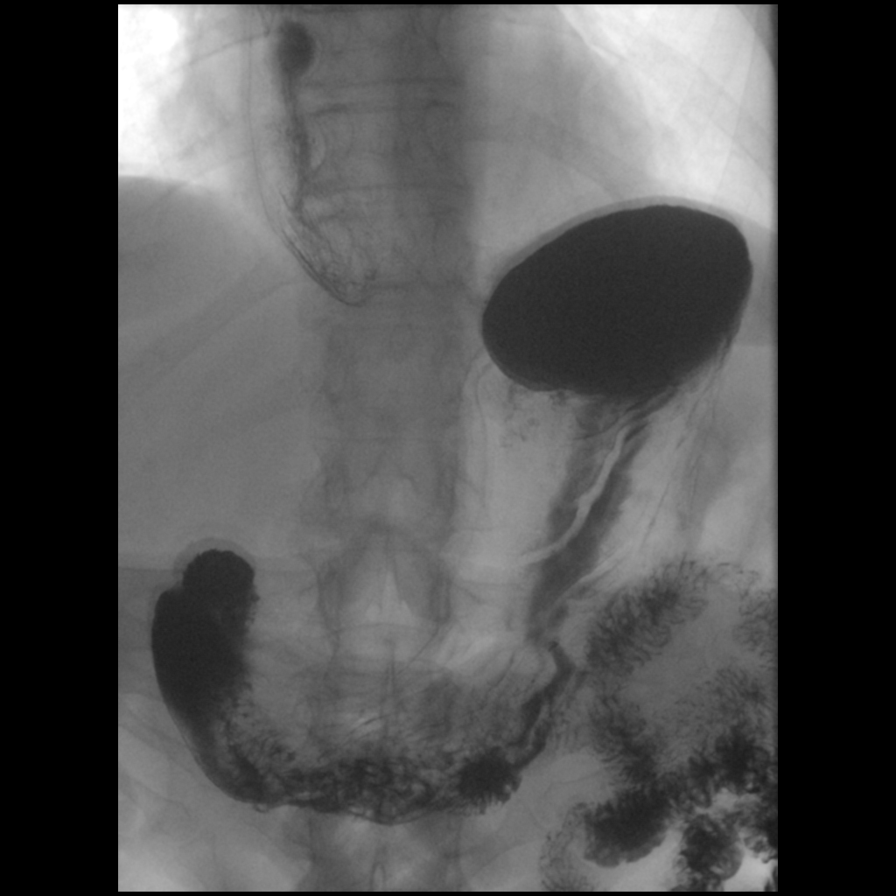

[12 of 24 positions shown; findings below may reference images not displayed]

FINDINGS: A double contrast study was undertaken and the patient tolerated
this well.

Patulous esophagus with mildly dilated phrenic ampulla. Often during
the exam there was a beaked appearance of retained barium in the
distal esophagus such as on series 17, image 8. However,
intermittently throughout the study the gastroesophageal junction
appeared more normal (series 2, also series 8, images 60- 62).

And when a 12.5 mm barium tablet was administered at the conclusion
of the study it passed freely to the stomach without delay.

Poor esophageal motility was noted with prone swallows. The
esophageal mucosal pattern is within normal limits.

No gastroesophageal reflux occurred spontaneously or was elicited.

Incidental prompt gastric emptying is noted.
IMPRESSION: 1. Sequelae of Eibhlin myotomy with a patulous esophagus and poor
esophageal motility. Intermittent achalasia type appearance of the
gastroesophageal junction, although at other times contrast and even
the 12.5 mm barium tablet passed freely.
2. No gastroesophageal reflux occurred spontaneously or was
elicited.

## 2019-05-09 NOTE — Telephone Encounter (Signed)
Received letter from [facility] that they have tried several times to contact the patient to schedule her [facility] referral appointment, per letter they are cancelling our request at this time  Dr Lavon Paganini aware

## 2020-04-23 ENCOUNTER — Other Ambulatory Visit: Payer: Self-pay | Admitting: Gastroenterology

## 2020-05-10 ENCOUNTER — Other Ambulatory Visit: Payer: Self-pay | Admitting: Gastroenterology

## 2020-07-04 ENCOUNTER — Telehealth: Payer: Self-pay | Admitting: Gastroenterology

## 2020-07-04 NOTE — Telephone Encounter (Signed)
Hey Dr Christella Hartigan, this is a former patient of yours that was referred to [facility] by Dr Lavon Paganini last seen 2021, pt would like to se be seen by [place] again, would you agree to take her on as a patient again?

## 2020-07-05 NOTE — Telephone Encounter (Signed)
Sure, I'm happy to see her back.

## 2020-07-11 NOTE — Telephone Encounter (Signed)
Left msg on pt's vm to call back
